# Patient Record
Sex: Female | Born: 2000 | Race: White | Hispanic: No | Marital: Single | Smoking: Never smoker
Health system: Southern US, Community
[De-identification: ages and names within clinical notes are randomized; demographics above are authoritative.]

## PROBLEM LIST (undated history)

## (undated) DIAGNOSIS — R29898 Other symptoms and signs involving the musculoskeletal system: Secondary | ICD-10-CM

## (undated) DIAGNOSIS — R112 Nausea with vomiting, unspecified: Secondary | ICD-10-CM

## (undated) DIAGNOSIS — K219 Gastro-esophageal reflux disease without esophagitis: Secondary | ICD-10-CM

## (undated) DIAGNOSIS — M2241 Chondromalacia patellae, right knee: Secondary | ICD-10-CM

## (undated) DIAGNOSIS — Z9889 Other specified postprocedural states: Secondary | ICD-10-CM

## (undated) DIAGNOSIS — R519 Headache, unspecified: Secondary | ICD-10-CM

## (undated) DIAGNOSIS — Z8489 Family history of other specified conditions: Secondary | ICD-10-CM

## (undated) DIAGNOSIS — M25761 Osteophyte, right knee: Secondary | ICD-10-CM

## (undated) DIAGNOSIS — S80212A Abrasion, left knee, initial encounter: Secondary | ICD-10-CM

## (undated) DIAGNOSIS — M6789 Other specified disorders of synovium and tendon, multiple sites: Secondary | ICD-10-CM

## (undated) HISTORY — PX: TYMPANOSTOMY TUBE PLACEMENT: SHX32

---
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
## 2019-12-30 ENCOUNTER — Other Ambulatory Visit: Payer: BC Managed Care – PPO

## 2020-01-02 ENCOUNTER — Other Ambulatory Visit: Payer: Self-pay

## 2020-01-02 ENCOUNTER — Other Ambulatory Visit
Admission: RE | Admit: 2020-01-02 | Discharge: 2020-01-02 | Disposition: A | Discharge status: Home / Self Care | Payer: BC Managed Care – PPO | Source: Ambulatory Visit | Attending: Gastroenterology | Admitting: Gastroenterology

## 2020-01-02 ENCOUNTER — Encounter: Payer: Self-pay | Admitting: *Deleted

## 2020-01-02 DIAGNOSIS — Z20822 Contact with and (suspected) exposure to covid-19: Secondary | ICD-10-CM | POA: Diagnosis not present

## 2020-01-02 DIAGNOSIS — Z01812 Encounter for preprocedural laboratory examination: Secondary | ICD-10-CM | POA: Insufficient documentation

## 2020-01-03 ENCOUNTER — Ambulatory Visit: Payer: BC Managed Care – PPO | Admitting: Anesthesiology

## 2020-01-03 ENCOUNTER — Encounter
Admission: RE | Disposition: A | Discharge status: Home / Self Care | Payer: Self-pay | Source: Home / Self Care | Attending: Gastroenterology

## 2020-01-03 ENCOUNTER — Other Ambulatory Visit: Payer: Self-pay

## 2020-01-03 ENCOUNTER — Ambulatory Visit
Admission: RE | Admit: 2020-01-03 | Discharge: 2020-01-03 | Disposition: A | Discharge status: Home / Self Care | Payer: BC Managed Care – PPO | Attending: Gastroenterology | Admitting: Gastroenterology

## 2020-01-03 DIAGNOSIS — K219 Gastro-esophageal reflux disease without esophagitis: Secondary | ICD-10-CM | POA: Insufficient documentation

## 2020-01-03 DIAGNOSIS — K2950 Unspecified chronic gastritis without bleeding: Secondary | ICD-10-CM | POA: Diagnosis not present

## 2020-01-03 DIAGNOSIS — Z7984 Long term (current) use of oral hypoglycemic drugs: Secondary | ICD-10-CM | POA: Insufficient documentation

## 2020-01-03 DIAGNOSIS — F329 Major depressive disorder, single episode, unspecified: Secondary | ICD-10-CM | POA: Insufficient documentation

## 2020-01-03 DIAGNOSIS — Z8379 Family history of other diseases of the digestive system: Secondary | ICD-10-CM | POA: Insufficient documentation

## 2020-01-03 DIAGNOSIS — R1013 Epigastric pain: Secondary | ICD-10-CM | POA: Diagnosis present

## 2020-01-03 DIAGNOSIS — Z793 Long term (current) use of hormonal contraceptives: Secondary | ICD-10-CM | POA: Diagnosis not present

## 2020-01-03 DIAGNOSIS — Z79899 Other long term (current) drug therapy: Secondary | ICD-10-CM | POA: Diagnosis not present

## 2020-01-03 DIAGNOSIS — F419 Anxiety disorder, unspecified: Secondary | ICD-10-CM | POA: Insufficient documentation

## 2020-01-03 HISTORY — DX: Headache, unspecified: R51.9

## 2020-01-03 HISTORY — PX: ESOPHAGOGASTRODUODENOSCOPY (EGD) WITH PROPOFOL: SHX5813

## 2020-01-03 LAB — SARS CORONAVIRUS 2 (TAT 6-24 HRS): SARS Coronavirus 2: NEGATIVE

## 2020-01-03 LAB — POCT PREGNANCY, URINE: Preg Test, Ur: NEGATIVE

## 2020-01-03 SURGERY — ESOPHAGOGASTRODUODENOSCOPY (EGD) WITH PROPOFOL
Anesthesia: General

## 2020-01-03 MED ORDER — LIDOCAINE HCL (PF) 2 % IJ SOLN
INTRAMUSCULAR | Status: AC
  Filled 2020-01-03: qty 5

## 2020-01-03 MED ORDER — PROPOFOL 10 MG/ML IV BOLUS
INTRAVENOUS | Status: DC | PRN
  Administered 2020-01-03 (×3): 40 mg via INTRAVENOUS

## 2020-01-03 MED ORDER — PROPOFOL 10 MG/ML IV BOLUS
INTRAVENOUS | Status: AC
  Filled 2020-01-03: qty 20

## 2020-01-03 MED ORDER — LIDOCAINE HCL (CARDIAC) PF 100 MG/5ML IV SOSY
PREFILLED_SYRINGE | INTRAVENOUS | Status: DC | PRN
Start: 2020-01-03 — End: 2020-01-03
  Administered 2020-01-03: 25 mg via INTRAVENOUS

## 2020-01-03 MED ORDER — SODIUM CHLORIDE 0.9 % IV SOLN: INTRAVENOUS | Status: DC

## 2020-01-03 MED ORDER — PROPOFOL 500 MG/50ML IV EMUL
INTRAVENOUS | Status: DC | PRN
  Administered 2020-01-03: 100 ug/kg/min via INTRAVENOUS

## 2020-01-03 NOTE — H&P (Signed)
Outpatient short stay form Pre-procedure 01/03/2020 11:49 AM Merlyn Lot MD, MPH  Primary Physician: Dr. Larwance Sachs  Reason for visit:  GERD/Dyspepsia  History of present illness:   19 y/o lady with history of GERD/nausea/vomiting here for EGD. States grandfather had crohn's disease. No blood thinners. No abdominal surgeries.    Current Facility-Administered Medications:  .  0.9 %  sodium chloride infusion, , Intravenous, Continuous, Avalin Briley, Rossie Muskrat, MD, Last Rate: 20 mL/hr at 01/03/20 1110, New Bag at 01/03/20 1110  Medications Prior to Admission  Medication Sig Dispense Refill Last Dose  . cyclobenzaprine (FLEXERIL) 5 MG tablet Take 5 mg by mouth 3 (three) times daily as needed for muscle spasms.     . drospirenone-ethinyl estradiol (NIKKI) 3-0.02 MG tablet Take 1 tablet by mouth daily.   01/03/2020 at Unknown time  . FLUoxetine (PROZAC) 20 MG tablet Take 20 mg by mouth daily.   01/02/2020 at Unknown time  . fluticasone (FLONASE) 50 MCG/ACT nasal spray Place 1 spray into both nostrils 2 (two) times daily.     . medroxyPROGESTERone (PROVERA) 10 MG tablet Take 10 mg by mouth.     . metFORMIN (GLUCOPHAGE-XR) 500 MG 24 hr tablet Take 500 mg by mouth daily with supper.   01/02/2020 at Unknown time  . omeprazole (PRILOSEC) 40 MG capsule Take 40 mg by mouth daily.   01/02/2020 at Unknown time  . progesterone (PROMETRIUM) 200 MG capsule Take 200 mg by mouth.     Marland Kitchen HYDROcodone-acetaminophen (NORCO) 5-325 MG tablet Take 1 tablet by mouth every 6 (six) hours as needed for moderate pain. 40 tablet 0   . minocycline (MINOCIN,DYNACIN) 50 MG capsule Take 20 mg by mouth daily.     Marland Kitchen omeprazole (PRILOSEC) 20 MG capsule Take 20 mg by mouth daily.     . ondansetron (ZOFRAN) 4 MG tablet Take 1 tablet (4 mg total) by mouth every 8 (eight) hours as needed for nausea or vomiting. 40 tablet 0      No Known Allergies   Past Medical History:  Diagnosis Date  . Abrasion of left knee 12/09/2016  . Acid  reflux   . 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 Extensor tendon disruption 11/2016   right lower extremity  . Family history of adverse reaction to anesthesia    maternal grandmother has hx. of post-op N/V  . Headache   . Osteophyte of right knee 11/2016  . PONV (postoperative nausea and vomiting)   . 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    Physical Exam  Gen: Alert, oriented. Appears stated age.  HEENT: [place]/AT. PERRLA. Lungs: No respiratory distress Abd: soft, benign, no masses.  Ext: No edema. Pulses 2+    Planned procedures: Proceed with EGD. The patient understands the nature of the planned procedure, indications, risks, alternatives and potential complications including but not limited to bleeding, infection, perforation, damage to internal organs and possible oversedation/side effects from anesthesia. The patient agrees and gives consent to proceed.  Please refer to procedure notes for findings, recommendations and patient disposition/instructions.     Merlyn Lot MD, MPH Gastroenterology 01/03/2020  11:49 AM

## 2020-01-03 NOTE — Transfer of Care (Signed)
Immediate Anesthesia Transfer of Care Note  Patient: Jamilee Vanleer  Procedure(s) Performed: ESOPHAGOGASTRODUODENOSCOPY (EGD) WITH PROPOFOL (N/A )  Patient Location: PACU and Endoscopy Unit  Anesthesia Type:MAC  Level of Consciousness: sedated  Airway & Oxygen Therapy: Patient Spontanous Breathing  Post-op Assessment: Report given to RN and Post -op Vital signs reviewed and stable  Post vital signs: Reviewed and stable  Last Vitals:  Vitals Value Taken Time  BP 96/51 01/03/20 1211  Temp    Pulse 81 01/03/20 1212  Resp 17 01/03/20 1212  SpO2 96 % 01/03/20 1212  Vitals shown include unvalidated device data.  Last Pain:  Vitals:   01/03/20 1107  TempSrc: Temporal  PainSc: 0-No pain         Complications: No complications documented.

## 2020-01-03 NOTE — Anesthesia Postprocedure Evaluation (Signed)
Anesthesia Post Note  Patient: Maureen Bullock  Procedure(s) Performed: ESOPHAGOGASTRODUODENOSCOPY (EGD) WITH PROPOFOL (N/A )  Patient location during evaluation: Endoscopy Anesthesia Type: General Level of consciousness: awake and alert Pain management: pain level controlled Vital Signs Assessment: post-procedure vital signs reviewed and stable Respiratory status: spontaneous breathing, nonlabored ventilation, respiratory function stable and patient connected to nasal cannula oxygen Cardiovascular status: blood pressure returned to baseline and stable Postop Assessment: no apparent nausea or vomiting Anesthetic complications: no   No complications documented.   Last Vitals:  Vitals:   01/03/20 1211 01/03/20 1221  BP: (!) 96/51 (!) 96/57  Pulse: 81 78  Resp: 17 20  Temp: (!) 35.9 C   SpO2: 96% 98%    Last Pain:  Vitals:   01/03/20 1221  TempSrc:   PainSc: 0-No pain                 Corinda Gubler

## 2020-01-03 NOTE — Anesthesia Preprocedure Evaluation (Signed)
Anesthesia Evaluation  Patient identified by MRN, date of birth, ID band Patient awake    Reviewed: Allergy & Precautions, NPO status , Patient's Chart, lab work & pertinent test results  History of Anesthesia Complications (+) PONV and history of anesthetic complications  Airway Mallampati: II  TM Distance: >3 FB Neck ROM: Full    Dental no notable dental hx. (+) Teeth Intact   Pulmonary neg pulmonary ROS, neg sleep apnea, neg COPD, Patient abstained from smoking.Not current smoker,    Pulmonary exam normal breath sounds clear to auscultation       Cardiovascular Exercise Tolerance: Good METS(-) CAD and (-) Past MI (-) dysrhythmias  Rhythm:Regular Rate:Normal - Systolic murmurs Patient says she has high blood pressure sometimes but attributes it to her anxiety   Neuro/Psych  Headaches, PSYCHIATRIC DISORDERS Anxiety    GI/Hepatic GERD  ,(+)     (-) substance abuse  ,   Endo/Other  neg diabetes  Renal/GU negative Renal ROS     Musculoskeletal   Abdominal   Peds  Hematology   Anesthesia Other Findings Past Medical History: 12/09/2016: Abrasion of left knee No date: Acid reflux 11/2016: 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 of post-op N/V No date: Headache 11/2016: Osteophyte of right knee No date: PONV (postoperative nausea and vomiting) No date: Popping of temporomandibular joint on opening of jaw     Comment:  wears mouthguard at night  Reproductive/Obstetrics                             Anesthesia Physical Anesthesia Plan  ASA: II  Anesthesia Plan: General   Post-op Pain Management:    Induction: Intravenous  PONV Risk Score and Plan: 4 or greater and Ondansetron, Propofol infusion and TIVA  Airway  Management Planned: Nasal Cannula  Additional Equipment: None  Intra-op Plan:   Post-operative Plan:   Informed Consent: I have reviewed the patients History and Physical, chart, labs and discussed the procedure including the risks, benefits and alternatives for the proposed anesthesia with the patient or authorized representative who has indicated his/her understanding and acceptance.     Dental advisory given  Plan Discussed with: CRNA and Surgeon  Anesthesia Plan Comments: (Discussed risks of anesthesia with patient, including possibility of difficulty with spontaneous ventilation under anesthesia necessitating airway intervention, PONV, and rare risks such as cardiac or respiratory or neurological events. Patient understands.)        Anesthesia Quick Evaluation

## 2020-01-03 NOTE — Op Note (Signed)
[facility] Gastroenterology Patient Name: Maureen Bullock Procedure Date: 01/03/2020 11:40 AM MRN: [id number] Account #: [id number] Date of Birth: 18-Oct-2000 Admit Type: Outpatient Age: 19 Room: [facility] ENDO ROOM 3 Gender: Female Note Status: Finalized Procedure:             Upper GI endoscopy Indications:           Epigastric abdominal pain, Gastro-esophageal reflux                         disease Providers:             Andrey Farmer MD, MD Medicines:             Monitored Anesthesia Care Complications:         No immediate complications. Estimated blood loss:                         Minimal. Procedure:             Pre-Anesthesia Assessment:                        - Prior to the procedure, a History and Physical was                         performed, and patient medications and allergies were                         reviewed. The patient is competent. The risks and                         benefits of the procedure and the sedation options and                         risks were discussed with the patient. All questions                         were answered and informed consent was obtained.                         Patient identification and proposed procedure were                         verified by the physician, the nurse, the anesthetist                         and the technician in the procedure room. Mental                         Status Examination: alert and oriented. Airway                         Examination: normal oropharyngeal airway and neck                         mobility. Respiratory Examination: clear to                         auscultation. CV Examination: normal. Prophylactic  Antibiotics: The patient does not require prophylactic                         antibiotics. Prior Anticoagulants: The patient has                         taken no previous anticoagulant or antiplatelet                         agents. ASA Grade  Assessment: I - A normal, healthy                         patient. After reviewing the risks and benefits, the                         patient was deemed in satisfactory condition to                         undergo the procedure. The anesthesia plan was to use                         monitored anesthesia care (MAC). Immediately prior to                         administration of medications, the patient was                         re-assessed for adequacy to receive sedatives. The                         heart rate, respiratory rate, oxygen saturations,                         blood pressure, adequacy of pulmonary ventilation, and                         response to care were monitored throughout the                         procedure. The physical status of the patient was                         re-assessed after the procedure.                        After obtaining informed consent, the endoscope was                         passed under direct vision. Throughout the procedure,                         the patient's blood pressure, pulse, and oxygen                         saturations were monitored continuously. The Endoscope                         was introduced through the mouth, and advanced to the  second part of duodenum. The upper GI endoscopy was                         accomplished without difficulty. The patient tolerated                         the procedure well. Findings:      The examined esophagus was normal.      The entire examined stomach was normal. Biopsies were taken with a cold       forceps for Helicobacter pylori testing. Estimated blood loss was       minimal.      The examined duodenum was normal. Biopsies for histology were taken with       a cold forceps for evaluation of celiac disease. Estimated blood loss       was minimal. Impression:            - Normal esophagus.                        - Normal stomach. Biopsied.                         - Normal examined duodenum. Biopsied. Recommendation:        - Discharge patient to home.                        - Resume previous diet.                        - Continue present medications.                        - Await pathology results.                        - Return to referring physician as previously                         scheduled. Procedure Code(s):     --- Professional ---                        [phone], Esophagogastroduodenoscopy, flexible,                         transoral; with biopsy, single or multiple Diagnosis Code(s):     --- Professional ---                        R10.13, Epigastric pain                        K21.9, Gastro-esophageal reflux disease without                         esophagitis CPT copyright 2019 American Medical Association. All rights reserved. The codes documented in this report are preliminary and upon coder review may  be revised to meet current compliance requirements. Andrey Farmer, MD Andrey Farmer MD, MD 01/03/2020 12:11:18 PM Number of Addenda: 0 Note Initiated On: 01/03/2020 11:40 AM Estimated Blood Loss:  Estimated blood loss was minimal.      [facility]

## 2020-01-03 NOTE — Interval H&P Note (Signed)
History and Physical Interval Note:  01/03/2020 11:52 AM  Maureen Bullock  has presented today for surgery, with the diagnosis of GERD  DYSPEPSIA.  The various methods of treatment have been discussed with the patient and family. After consideration of risks, benefits and other options for treatment, the patient has consented to  Procedure(s): ESOPHAGOGASTRODUODENOSCOPY (EGD) WITH PROPOFOL (N/A) as a surgical intervention.  The patient's history has been reviewed, patient examined, no change in status, stable for surgery.  I have reviewed the patient's chart and labs.  Questions were answered to the patient's satisfaction.     Regis Bill  Ok to proceed with EGD.

## 2020-01-04 ENCOUNTER — Encounter: Payer: Self-pay | Admitting: Gastroenterology

## 2020-01-04 LAB — SURGICAL PATHOLOGY

## 2020-03-07 ENCOUNTER — Emergency Department: Payer: BC Managed Care – PPO

## 2020-03-07 ENCOUNTER — Emergency Department
Admission: EM | Admit: 2020-03-07 | Discharge: 2020-03-07 | Disposition: A | Discharge status: Home / Self Care | Payer: BC Managed Care – PPO | Attending: Student in an Organized Health Care Education/Training Program | Admitting: Student in an Organized Health Care Education/Training Program

## 2020-03-07 ENCOUNTER — Other Ambulatory Visit: Payer: Self-pay

## 2020-03-07 ENCOUNTER — Encounter: Payer: Self-pay | Admitting: Emergency Medicine

## 2020-03-07 DIAGNOSIS — R569 Unspecified convulsions: Secondary | ICD-10-CM | POA: Diagnosis not present

## 2020-03-07 DIAGNOSIS — R519 Headache, unspecified: Secondary | ICD-10-CM | POA: Diagnosis not present

## 2020-03-07 DIAGNOSIS — Z20822 Contact with and (suspected) exposure to covid-19: Secondary | ICD-10-CM | POA: Insufficient documentation

## 2020-03-07 DIAGNOSIS — R112 Nausea with vomiting, unspecified: Secondary | ICD-10-CM | POA: Diagnosis not present

## 2020-03-07 LAB — BASIC METABOLIC PANEL
Anion gap: 12 (ref 5–15)
BUN: 7 mg/dL (ref 6–20)
CO2: 24 mmol/L (ref 22–32)
Calcium: 9.5 mg/dL (ref 8.9–10.3)
Chloride: 105 mmol/L (ref 98–111)
Creatinine, Ser: 0.64 mg/dL (ref 0.44–1.00)
GFR, Estimated: >60 mL/min (ref >60)
Glucose, Bld: 100 mg/dL — ABNORMAL HIGH (ref 70–99)
Potassium: 3.8 mmol/L (ref 3.5–5.1)
Sodium: 141 mmol/L (ref 135–145)

## 2020-03-07 LAB — RESP PANEL BY RT-PCR (FLU A&B, COVID) ARPGX2
Influenza A by PCR: NEGATIVE
Influenza B by PCR: NEGATIVE
SARS Coronavirus 2 by RT PCR: NEGATIVE

## 2020-03-07 LAB — URINALYSIS, COMPLETE (UACMP) WITH MICROSCOPIC
Bilirubin Urine: NEGATIVE
Glucose, UA: NEGATIVE mg/dL
Ketones, ur: NEGATIVE mg/dL
Leukocytes,Ua: NEGATIVE
Nitrite: NEGATIVE
Protein, ur: NEGATIVE mg/dL
Specific Gravity, Urine: 1.016 (ref 1.005–1.030)
Squamous Epithelial / HPF: NONE SEEN (ref 0–5)
pH: 5 (ref 5.0–8.0)

## 2020-03-07 LAB — CBC
HCT: 38 % (ref 36.0–46.0)
Hemoglobin: 12.9 g/dL (ref 12.0–15.0)
MCH: 28.8 pg (ref 26.0–34.0)
MCHC: 33.9 g/dL (ref 30.0–36.0)
MCV: 84.8 fL (ref 80.0–100.0)
Platelets: 415 10*3/uL — ABNORMAL HIGH (ref 150–400)
RBC: 4.48 MIL/uL (ref 3.87–5.11)
RDW: 12.8 % (ref 11.5–15.5)
WBC: 13.9 10*3/uL — ABNORMAL HIGH (ref 4.0–10.5)
nRBC: 0 % (ref 0.0–0.2)

## 2020-03-07 LAB — POC URINE PREG, ED: Preg Test, Ur: NEGATIVE

## 2020-03-07 MED ORDER — PROCHLORPERAZINE EDISYLATE 10 MG/2ML IJ SOLN
10.0000 mg | Freq: Once | INTRAMUSCULAR | Status: AC
  Administered 2020-03-07: 10 mg via INTRAVENOUS
  Filled 2020-03-07: qty 2

## 2020-03-07 MED ORDER — SODIUM CHLORIDE 0.9 % IV BOLUS
1000.0000 mL | Freq: Once | INTRAVENOUS | Status: AC
  Administered 2020-03-07: 1000 mL via INTRAVENOUS

## 2020-03-07 MED ORDER — ACETAMINOPHEN 500 MG PO TABS
1000.0000 mg | ORAL_TABLET | Freq: Once | ORAL | Status: AC
  Administered 2020-03-07: 1000 mg via ORAL
  Filled 2020-03-07: qty 2

## 2020-03-07 NOTE — ED Provider Notes (Signed)
[facility] Emergency Department Provider Note    First MD Initiated Contact with Patient 03/07/20 1525     (approximate)  I have reviewed the triage vital signs and the nursing notes.   HISTORY  Chief Complaint Seizures    HPI Maureen Bullock is a 19 y.o. female below listed past medical history presents to the ER for evaluation of episode of unusual behavior last night described as seizure-like activity while she was drinking with friends.  States that she had been drinking hard seltzers and had episode where she was screaming and shaking.  Her friends told her that she went unresponsive for a few seconds and then came back to and was speaking incomprehensible sounds.  States that she did wet herself but did not bite her tongue.  No history of seizures has been having some dysuria no discharge.  No flank pain.  Does have a headache today as well as generalized nausea and has had some vomiting.  Denies any head trauma.    Past Medical History:  Diagnosis Date  . Abrasion of left knee 12/09/2016  . Acid reflux   . 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 Extensor tendon disruption 11/2016   right lower extremity  . Family history of adverse reaction to anesthesia    maternal grandmother has hx. of post-op N/V  . Headache   . Osteophyte of right knee 11/2016  . PONV (postoperative nausea and vomiting)   . 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 Diabetes Maternal Grandmother   . Hypertension Maternal Grandmother   . Anesthesia problems Maternal Grandmother        post-op N/V  . Diabetes Maternal Grandfather   . Hypertension Maternal Grandfather   . Aplastic anemia Maternal Grandfather   . Hypertension Paternal Grandmother    Past Surgical History:  Procedure Laterality Date  . BONE EXCISION Right 12/11/2016   Procedure: TIBIAL SPUR REMOVAL;  Surgeon:  Loreta Ave, MD;  Location: [facility];  Service: Orthopedics;  Laterality: Right;  . ESOPHAGOGASTRODUODENOSCOPY (EGD) WITH PROPOFOL N/A 01/03/2020   Procedure: ESOPHAGOGASTRODUODENOSCOPY (EGD) WITH PROPOFOL;  Surgeon: Regis Bill, MD;  Location: [facility];  Service: Endoscopy;  Laterality: N/A;  . KNEE ARTHROSCOPY WITH EXCISION PLICA Right 12/11/2016   Procedure: KNEE ARTHROSCOPY WITH EXCISION OF MEDIAL PLICA;  Surgeon: Loreta Ave, MD;  Location: [facility];  Service: Orthopedics;  Laterality: Right;  . PATELLAR TENDON REPAIR Right 12/11/2016   Procedure: OPEN PATELLA TENDON REPAIR, RIGHT KNEE;  Surgeon: Loreta Ave, MD;  Location: [facility];  Service: Orthopedics;  Laterality: Right;  . TYMPANOSTOMY TUBE PLACEMENT Bilateral    There are no problems to display for this patient.     Prior to Admission medications   Medication Sig Start Date End Date Taking? Authorizing Provider  drospirenone-ethinyl estradiol (NIKKI) 3-0.02 MG tablet Take 1 tablet by mouth daily.   Yes [provider]  FLUoxetine (PROZAC) 20 MG capsule Take 20 mg by mouth daily.    Yes [provider]  metFORMIN (GLUCOPHAGE-XR) 500 MG 24 hr tablet Take 500 mg by mouth daily with supper.   Yes [provider]  omeprazole (PRILOSEC) 40 MG capsule Take 40 mg by mouth daily.   Yes [provider]    Allergies Patient has no known allergies.  Social History Social History   Tobacco Use  . Smoking status: Never Smoker  . Smokeless tobacco: Never Used  Vaping Use  . Vaping Use: Never used  Substance Use Topics  . Alcohol use: No  . Drug use: No    Review of Systems Patient denies headaches, rhinorrhea, blurry vision, numbness, shortness of breath, chest pain, edema, cough, abdominal pain, nausea, vomiting, diarrhea, dysuria, fevers, rashes or hallucinations unless otherwise stated above in  HPI. ____________________________________________   PHYSICAL EXAM:  VITAL SIGNS: Vitals:   03/07/20 1300 03/07/20 1605  BP: 140/90 128/79  Pulse: (!) 120 (!) 101  Resp: 17 20  Temp: 99.5 F (37.5 C)   SpO2: 99% 98%    Constitutional: Alert and oriented.  Eyes: Conjunctivae are normal.  Head: Atraumatic. Nose: No congestion/rhinnorhea. Mouth/Throat: Mucous membranes are moist.   Neck: No stridor. Painless ROM.  Cardiovascular: Normal rate, regular rhythm. Grossly normal heart sounds.  Good peripheral circulation. Respiratory: Normal respiratory effort.  No retractions. Lungs CTAB. Gastrointestinal: Soft and nontender. No distention. No abdominal bruits. No CVA tenderness. Genitourinary:  Musculoskeletal: No lower extremity tenderness nor edema.  No joint effusions. Neurologic:  Normal speech and language. No gross focal neurologic deficits are appreciated. No facial droop Skin:  Skin is warm, dry and intact. No rash noted. Psychiatric: Mood and affect are normal. Speech and behavior are normal.  ____________________________________________   LABS (all labs ordered are listed, but only abnormal results are displayed)  Results for orders placed or performed during the hospital encounter of 03/07/20 (from the past 24 hour(s))  Basic metabolic panel - if new onset seizures     Status: Abnormal   Collection Time: 03/07/20 12:57 PM  Result Value Ref Range   Sodium 141 135 - 145 mmol/L   Potassium 3.8 3.5 - 5.1 mmol/L   Chloride 105 98 - 111 mmol/L   CO2 24 22 - 32 mmol/L   Glucose, Bld 100 (H) 70 - 99 mg/dL   BUN 7 6 - 20 mg/dL   Creatinine, Ser 4.090.64 0.44 - 1.00 mg/dL   Calcium 9.5 8.9 - 81.110.3 mg/dL   GFR, Estimated >91>60 >47>60 mL/min   Anion gap 12 5 - 15  CBC - if new onset seizures     Status: Abnormal   Collection Time: 03/07/20 12:57 PM  Result Value Ref Range   WBC 13.9 (H) 4.0 - 10.5 K/uL   RBC 4.48 3.87 - 5.11 MIL/uL   Hemoglobin 12.9 12.0 - 15.0 g/dL   HCT 82.938.0  36 - 46 %   MCV 84.8 80.0 - 100.0 fL   MCH 28.8 26.0 - 34.0 pg   MCHC 33.9 30.0 - 36.0 g/dL   RDW 56.212.8 13.011.5 - 86.515.5 %   Platelets 415 (H) 150 - 400 K/uL   nRBC 0.0 0.0 - 0.2 %  Urinalysis, Complete w Microscopic Urine, Clean Catch     Status: Abnormal   Collection Time: 03/07/20 12:57 PM  Result Value Ref Range   Color, Urine YELLOW (A) YELLOW   APPearance HAZY (A) CLEAR   Specific Gravity, Urine 1.016 1.005 - 1.030   pH 5.0 5.0 - 8.0   Glucose, UA NEGATIVE NEGATIVE mg/dL   Hgb urine dipstick SMALL (A) NEGATIVE   Bilirubin Urine NEGATIVE NEGATIVE   Ketones, ur NEGATIVE NEGATIVE mg/dL   Protein, ur NEGATIVE NEGATIVE mg/dL   Nitrite NEGATIVE NEGATIVE   Leukocytes,Ua NEGATIVE NEGATIVE   RBC / HPF 0-5 0 - 5 RBC/hpf   WBC,  UA 0-5 0 - 5 WBC/hpf   Bacteria, UA RARE (A) NONE SEEN   Squamous Epithelial / LPF NONE SEEN 0 - 5   Mucus PRESENT   POC urine preg, ED     Status: None   Collection Time: 03/07/20  4:08 PM  Result Value Ref Range   Preg Test, Ur Negative Negative  Resp Panel by RT-PCR (Flu A&B, Covid) Nasopharyngeal Swab     Status: None   Collection Time: 03/07/20  4:23 PM   Specimen: Nasopharyngeal Swab; Nasopharyngeal(NP) swabs in vial transport medium  Result Value Ref Range   SARS Coronavirus 2 by RT PCR NEGATIVE NEGATIVE   Influenza A by PCR NEGATIVE NEGATIVE   Influenza B by PCR NEGATIVE NEGATIVE   ____________________________________________  EKG My review and personal interpretation at Time: 16:02   Indication: syncope  Rate: 100  Rhythm: sinus Axis: normal Other: normal intervals, no stemi ____________________________________________  RADIOLOGY  I personally reviewed all radiographic images ordered to evaluate for the above acute complaints and reviewed radiology reports and findings.  These findings were personally discussed with the patient.  Please see medical record for radiology  report.  ____________________________________________   PROCEDURES  Procedure(s) performed:  Procedures    Critical Care performed: no ____________________________________________   INITIAL IMPRESSION / ASSESSMENT AND PLAN / ED COURSE  Pertinent labs & imaging results that were available during my care of the patient were reviewed by me and considered in my medical decision making (see chart for details).   DDX: Dehydration, dysrhythmia, seizure, stress reaction, alcohol intoxication  Maureen Bullock is a 19 y.o. who presents to the ED with presentation as described above.  Patient clinically well-appearing nontoxic.  Mildly tachycardic otherwise well perfused.  Suspect a component of dehydration in setting of admitted alcohol use last night.  Her neuro exam is reassuring but given this new onset of possible seizure episode will order CT imaging as well as blood work.  Description of the event is quite atypical for seizure.  She is not meningitic or encephalopathic at this time.  EKG without any evidence of preexcitation syndrome.  Clinical Course as of Mar 07 1738  Wed Mar 07, 2020  1707 Patient reassessed.  Feels improved.  CT imaging is reassuring.  Blood work reassuring.  Is not consistent with encephalitis or meningitis.  Her neuro exam is without focal deficits.  She does appear improved after some IV hydration and I expect some the symptoms are secondary to lingering effects of alcohol.  Given this episode I will give referral to neurology and I think she is appropriate for outpatient follow-up.  We discussed seizure first-aid and advised her not to drive until cleared by pcp or neurology.  Discussed strict precautions.  Patient agreeable to plan.   [PR]    Clinical Course User Index [PR] Willy Eddy, MD    The patient was evaluated in Emergency Department today for the symptoms described in the history of present illness. He/she was evaluated in the context of the global  COVID-19 pandemic, which necessitated consideration that the patient might be at risk for infection with the SARS-CoV-2 virus that causes COVID-19. Institutional protocols and algorithms that pertain to the evaluation of patients at risk for COVID-19 are in a state of rapid change based on information released by regulatory bodies including the CDC and federal and state organizations. These policies and algorithms were followed during the patient's care in the ED.  As part of my medical decision making, I reviewed  the following data within the electronic MEDICAL RECORD NUMBER Nursing notes reviewed and incorporated, Labs reviewed, notes from prior ED visits and [place] Controlled Substance Database   ____________________________________________   FINAL CLINICAL IMPRESSION(S) / ED DIAGNOSES  Final diagnoses:  Seizure-like activity (HCC)      NEW MEDICATIONS STARTED DURING THIS VISIT:  New Prescriptions   No medications on file     Note:  This document was prepared using Dragon voice recognition software and may include unintentional dictation errors.    Willy Eddy, MD 03/07/20 1739

## 2020-03-07 NOTE — ED Triage Notes (Signed)
Pt comes into the ED via POV from the [facility] clinic where there is concern for possible seizures last night.  Pt states she had 4 alcoholic beverages and then her friends informed her that she had convulsion like symptoms.  Pt states it is unknown if she ever had LOC.  Pt has no h/o seizures.  Pt is alert and oriented x4 and per mother has been able to answer questions reasonably at baseline.  Pt and her mother states that she seems more lethargic than normal.  Pt in NAD at this time with even and unlabored respirations.

## 2020-03-07 NOTE — ED Triage Notes (Signed)
Pt here for LOC. Per MD from [facility] the patient had episode of convulsions following a night of drinking.

## 2021-11-10 IMAGING — CT CT HEAD W/O CM
3 series · 16 of 47 positions shown, 19 images · non-contrast
Comparison: August 31, 2013

CLINICAL DATA: Seizure.

EXAM:
CT HEAD WITHOUT CONTRAST
TECHNIQUE: Contiguous axial images were obtained from the base of the skull
through the vertex without intravenous contrast.

[Series 2: head wo · axial · 0.42mm/px · z∈[-156,-31]mm · 10 of 30 slices shown, 13 images]
[im 3/30  brain]
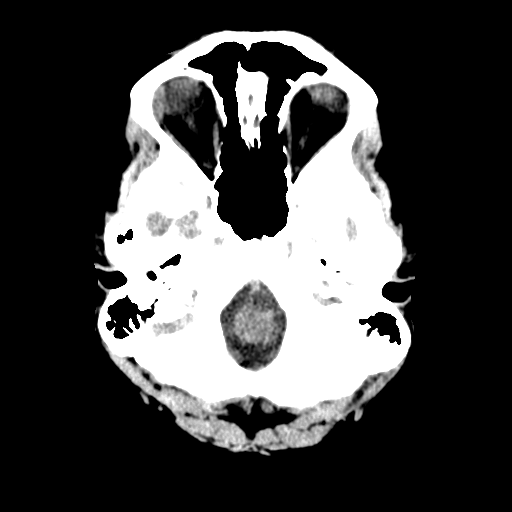
[im 3/30  bone]
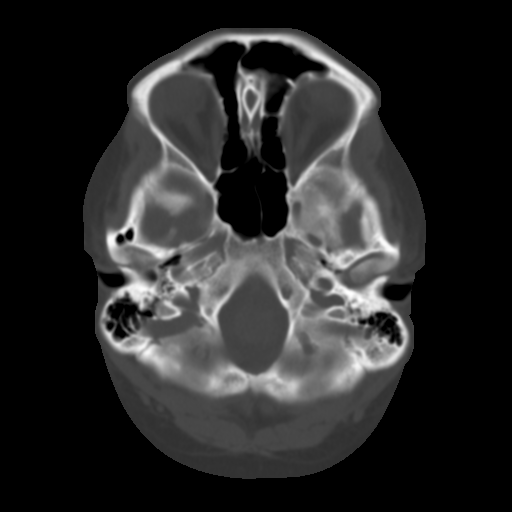
[im 6/30  brain]
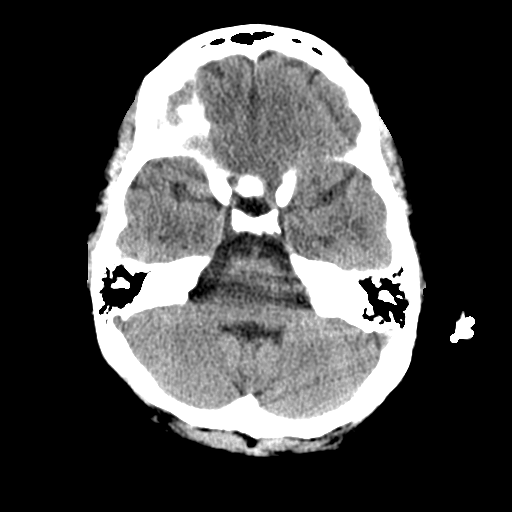
[im 9/30  brain]
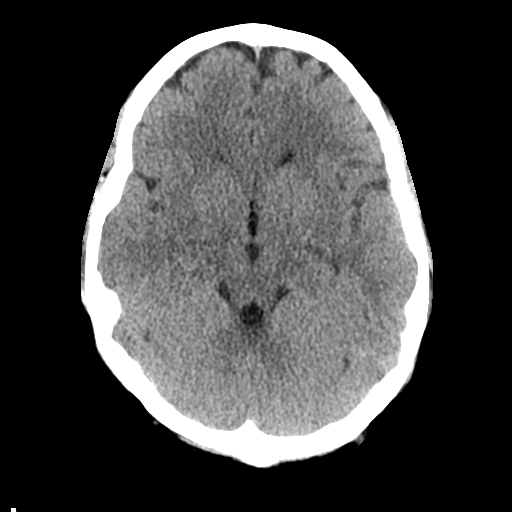
[im 11/30  brain]
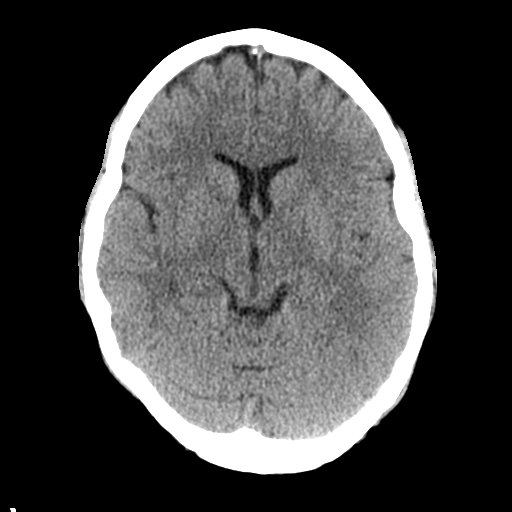
[im 14/30  brain]
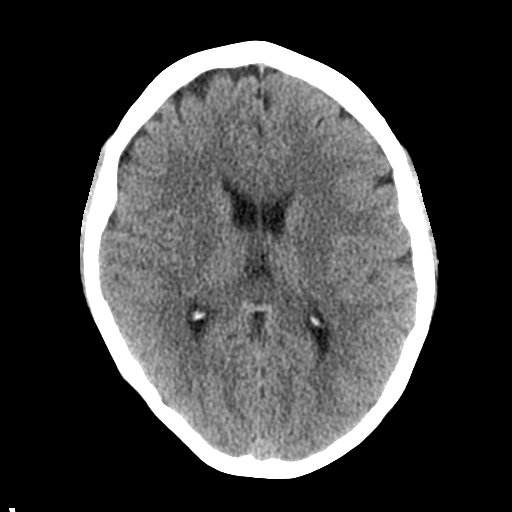
[im 14/30  bone]
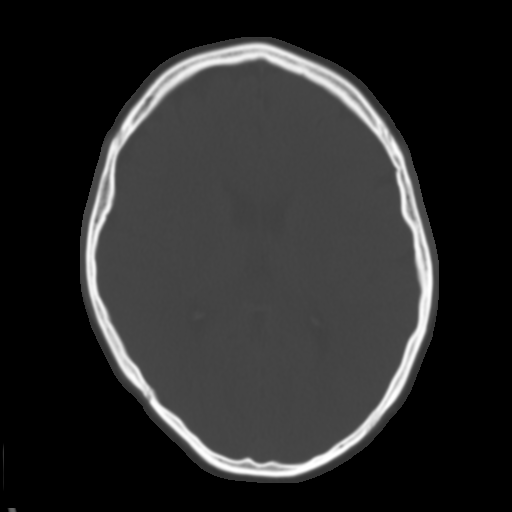
[im 17/30  brain]
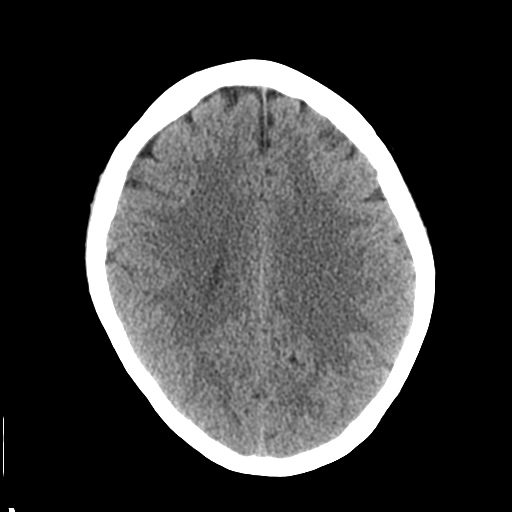
[im 20/30  brain]
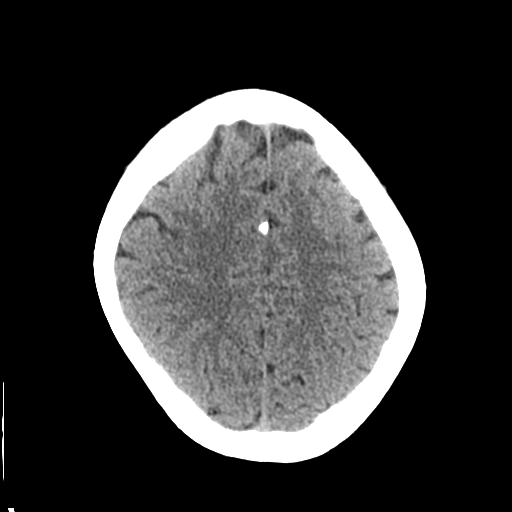
[im 23/30  brain]
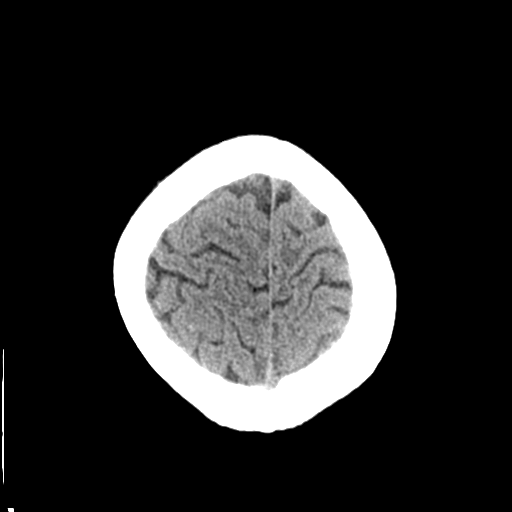
[im 25/30  brain]
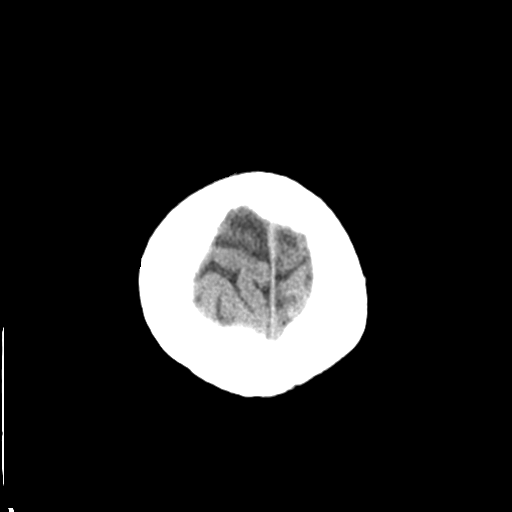
[im 25/30  bone]
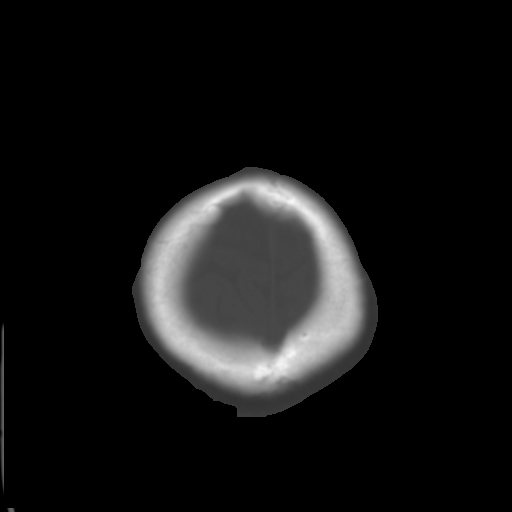
[im 28/30  brain]
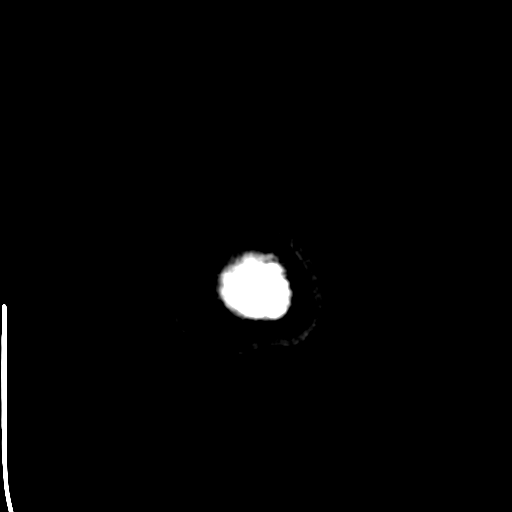

[Series 4: coronal soft tissue · coronal · 0.30mm/px · 3 of 66 slices shown]
[im 22/66  brain]
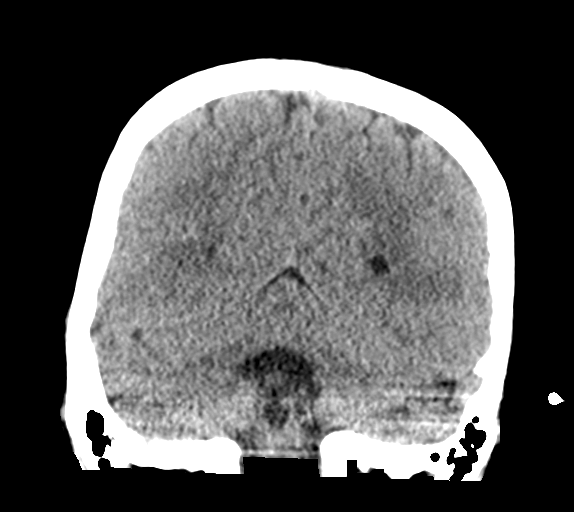
[im 29/66  brain]
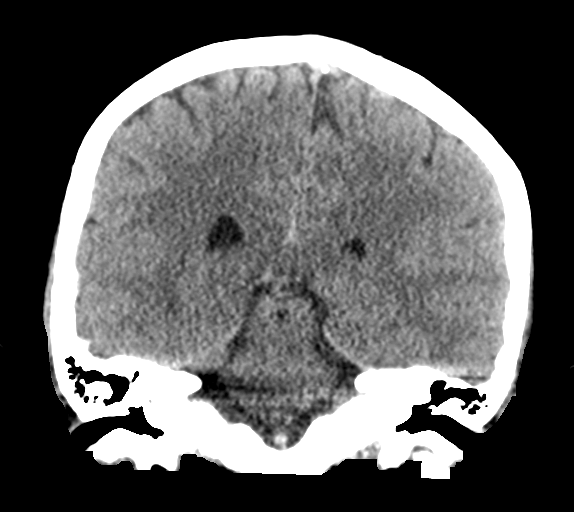
[im 37/66  brain]
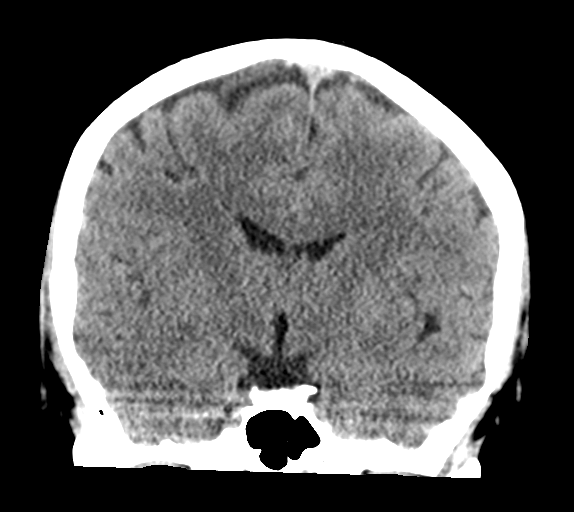

[Series 5: sagittal soft tissue · sagittal · 0.32mm/px · 3 of 56 slices shown]
[im 19/56  brain]
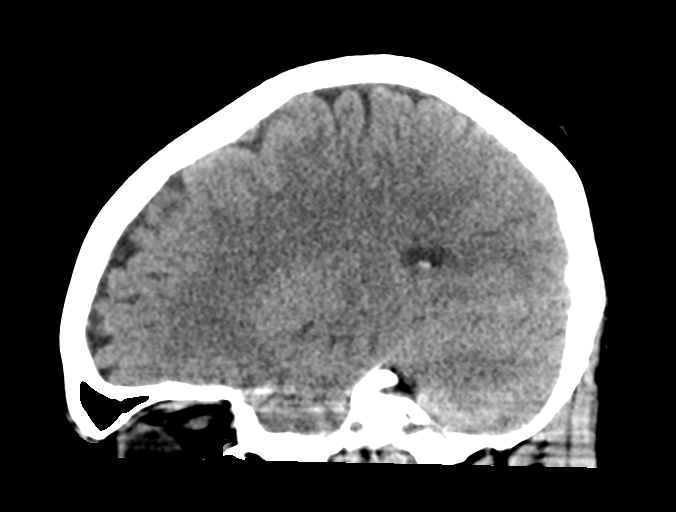
[im 28/56  brain]
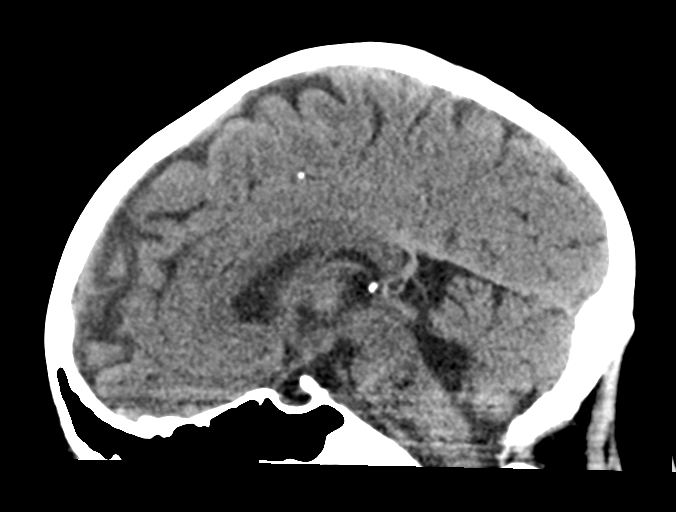
[im 37/56  brain]
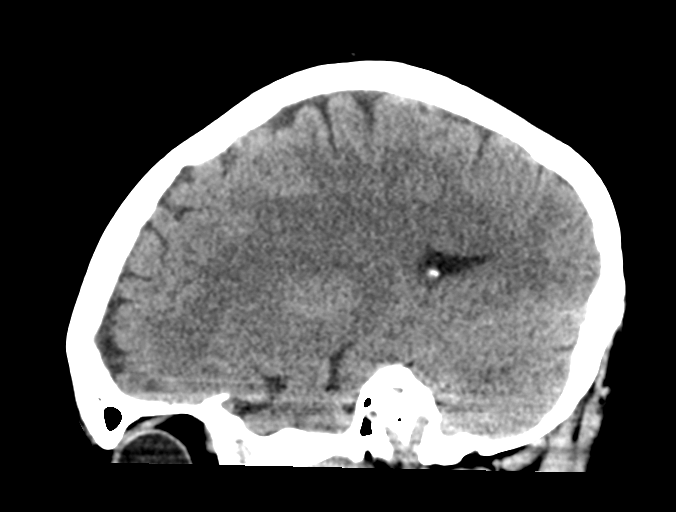

[16 of 47 positions shown; findings below may reference images not displayed]

FINDINGS: Brain: No evidence of acute infarction, hemorrhage, hydrocephalus,
extra-axial collection or mass lesion/mass effect.

Vascular: No hyperdense vessel or unexpected calcification.

Skull: Normal. Negative for fracture or focal lesion.

Sinuses/Orbits: No acute finding.

Other: None.
IMPRESSION: No acute intracranial pathology.

## 2021-11-10 IMAGING — DX DG CHEST 1V PORT
1 series · 1 of 1 positions shown · non-contrast
Comparison: None.

CLINICAL DATA: Syncope

EXAM:
PORTABLE CHEST 1 VIEW

[chest ap]
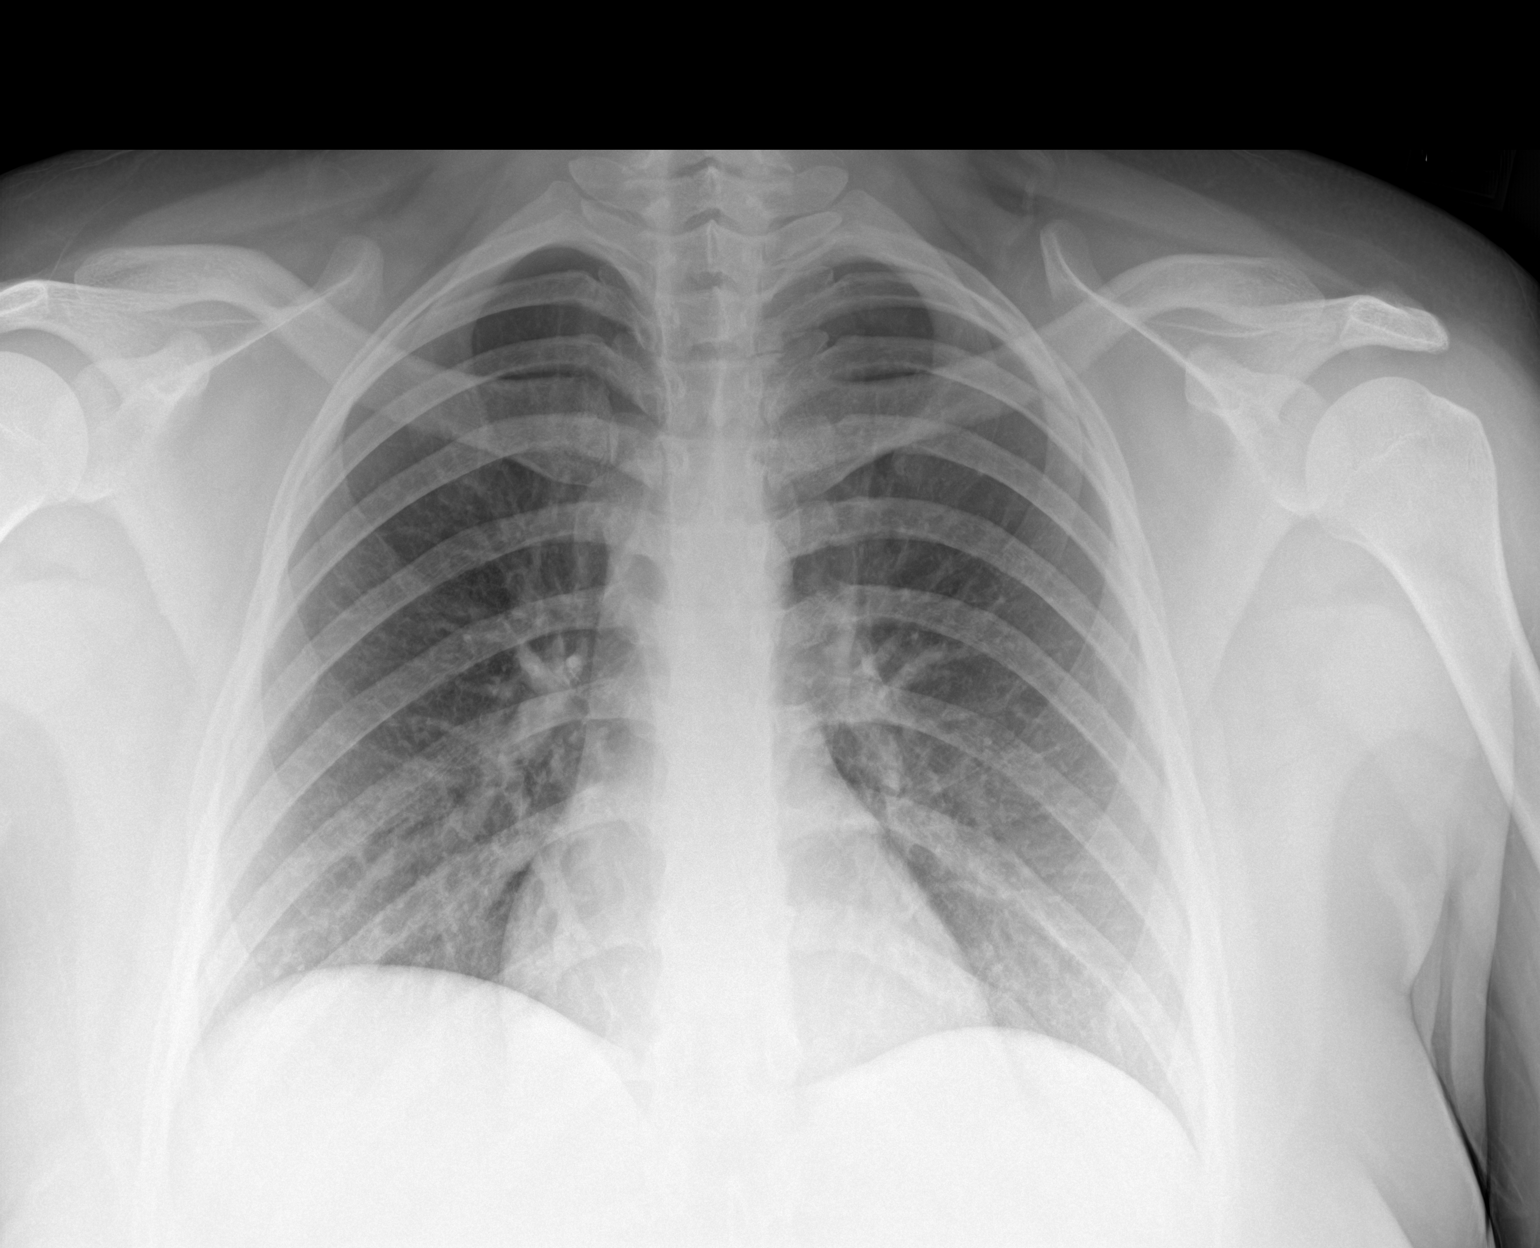

[1 of 1 positions shown; findings below may reference images not displayed]

FINDINGS: The heart size and mediastinal contours are within normal limits.
Both lungs are clear. The visualized skeletal structures are
unremarkable.
IMPRESSION: No active disease.
